# Patient Record
Sex: Female | Born: 1940 | Race: White | Hispanic: No | Smoking: Never smoker
Health system: Southern US, Community
[De-identification: ages and names within clinical notes are randomized; demographics above are authoritative.]

## PROBLEM LIST (undated history)

## (undated) DIAGNOSIS — I10 Essential (primary) hypertension: Secondary | ICD-10-CM

---

## 1997-06-03 ENCOUNTER — Other Ambulatory Visit: Admission: RE | Admit: 1997-06-03 | Discharge: 1997-06-03 | Payer: Self-pay | Admitting: Internal Medicine

## 2000-09-04 ENCOUNTER — Other Ambulatory Visit: Admission: RE | Admit: 2000-09-04 | Discharge: 2000-09-04 | Payer: Self-pay | Admitting: Gynecology

## 2002-03-15 ENCOUNTER — Encounter: Payer: Self-pay | Admitting: Emergency Medicine

## 2002-03-15 ENCOUNTER — Emergency Department (HOSPITAL_COMMUNITY): Admission: EM | Admit: 2002-03-15 | Discharge: 2002-03-15 | Payer: Self-pay | Admitting: Emergency Medicine

## 2009-06-22 DIAGNOSIS — J309 Allergic rhinitis, unspecified: Secondary | ICD-10-CM | POA: Insufficient documentation

## 2009-06-22 DIAGNOSIS — E559 Vitamin D deficiency, unspecified: Secondary | ICD-10-CM | POA: Insufficient documentation

## 2009-06-22 DIAGNOSIS — M810 Age-related osteoporosis without current pathological fracture: Secondary | ICD-10-CM | POA: Insufficient documentation

## 2010-02-06 DIAGNOSIS — R030 Elevated blood-pressure reading, without diagnosis of hypertension: Secondary | ICD-10-CM | POA: Insufficient documentation

## 2011-04-09 DIAGNOSIS — M549 Dorsalgia, unspecified: Secondary | ICD-10-CM | POA: Insufficient documentation

## 2012-06-02 DIAGNOSIS — H9190 Unspecified hearing loss, unspecified ear: Secondary | ICD-10-CM | POA: Insufficient documentation

## 2012-06-02 DIAGNOSIS — R946 Abnormal results of thyroid function studies: Secondary | ICD-10-CM | POA: Insufficient documentation

## 2013-07-16 DIAGNOSIS — F419 Anxiety disorder, unspecified: Secondary | ICD-10-CM | POA: Insufficient documentation

## 2013-08-26 DIAGNOSIS — G479 Sleep disorder, unspecified: Secondary | ICD-10-CM | POA: Insufficient documentation

## 2013-08-26 DIAGNOSIS — R232 Flushing: Secondary | ICD-10-CM | POA: Insufficient documentation

## 2013-08-26 DIAGNOSIS — R5381 Other malaise: Secondary | ICD-10-CM | POA: Insufficient documentation

## 2014-05-25 DIAGNOSIS — R600 Localized edema: Secondary | ICD-10-CM | POA: Insufficient documentation

## 2017-06-17 DIAGNOSIS — D72829 Elevated white blood cell count, unspecified: Secondary | ICD-10-CM | POA: Insufficient documentation

## 2017-06-24 DIAGNOSIS — J3489 Other specified disorders of nose and nasal sinuses: Secondary | ICD-10-CM | POA: Insufficient documentation

## 2017-06-24 DIAGNOSIS — R634 Abnormal weight loss: Secondary | ICD-10-CM | POA: Insufficient documentation

## 2017-06-28 ENCOUNTER — Other Ambulatory Visit: Payer: Self-pay | Admitting: Internal Medicine

## 2017-07-04 ENCOUNTER — Other Ambulatory Visit: Payer: Self-pay | Admitting: Internal Medicine

## 2017-07-04 DIAGNOSIS — R634 Abnormal weight loss: Secondary | ICD-10-CM

## 2017-07-07 ENCOUNTER — Ambulatory Visit
Admission: RE | Admit: 2017-07-07 | Discharge: 2017-07-07 | Disposition: A | Discharge status: Home / Self Care | Payer: Medicare Other | Source: Ambulatory Visit | Attending: Internal Medicine | Admitting: Internal Medicine

## 2017-07-07 DIAGNOSIS — R634 Abnormal weight loss: Secondary | ICD-10-CM

## 2017-07-07 MED ORDER — IOPAMIDOL (ISOVUE-300) INJECTION 61%
75.0000 mL | Freq: Once | INTRAVENOUS | Status: AC | PRN
  Administered 2017-07-07: 75 mL via INTRAVENOUS

## 2017-07-10 DIAGNOSIS — J479 Bronchiectasis, uncomplicated: Secondary | ICD-10-CM | POA: Insufficient documentation

## 2017-07-16 DIAGNOSIS — R350 Frequency of micturition: Secondary | ICD-10-CM | POA: Insufficient documentation

## 2017-08-06 ENCOUNTER — Encounter: Payer: Self-pay | Admitting: Pulmonary Disease

## 2017-08-06 ENCOUNTER — Ambulatory Visit (INDEPENDENT_AMBULATORY_CARE_PROVIDER_SITE_OTHER): Payer: Medicare Other | Admitting: Pulmonary Disease

## 2017-08-06 DIAGNOSIS — J479 Bronchiectasis, uncomplicated: Secondary | ICD-10-CM | POA: Diagnosis not present

## 2017-08-06 DIAGNOSIS — J471 Bronchiectasis with (acute) exacerbation: Secondary | ICD-10-CM

## 2017-08-06 NOTE — Patient Instructions (Signed)
We discussed that you have mild bronchiectasis which may be a result of full damage to the lungs or related to mycobacterial infection  Schedule PFTs. Schedule high-resolution CT chest without contrast in 6 months.  Meantime if you develop a bad chest cold with sputum production, call us so that we can obtain culture

## 2017-08-06 NOTE — Progress Notes (Signed)
   Subjective:    Patient ID: Maria BignessHelen M Juliana, female    DOB: Jun 02, 1940, 77 y.o.   MRN: [id number]  HPI  Chief Complaint  Patient presents with  . pulmonary consult    referred by Dr. Timothy Lassousso for bronchitis and pneumonia   77 year old never smoker presents for evaluation of abnormal CT scan. She had an episode of bronchitis in 12/2016 that was treated with cefdinir.  She had a.  Of usual state of health and then got sick again in 05/2017, this time she was initially given antibiotics over the phone and then came in for an office visit with chest x-ray suggesting pneumonia so she was treated with Levaquin for 10 days. CT chest was obtained 07/07/2017 which I personally reviewed, this showed tree-in-bud opacities and mild bronchiectasis in the right middle lobe, left upper lobe and lingula.  Possibility of atypical Mycobacterium infection or  aspiration was raised.   she feels recovered and is back to her baseline.  She reports temperature to 99 range and wonders if that is a fever.  She has lost about 10 pounds in the past year but attributes this to living by herself She denies persistent cough or night sweats.  Social She is a retired Government social research officeroffice assistant from [employer], she is widowed for 9 years  Past medical history of hypertension Past surgical history none  Family history of heart disease in her father    No past medical history on file.   Review of Systems  Constitutional: Positive for fatigue and fever.  HENT: Positive for postnasal drip, rhinorrhea, sinus pressure, sinus pain and sneezing.   Respiratory: Positive for cough.   Neurological: Positive for headaches.   Cardiovascular: negative for chest pain, dyspnea, lower extremity edema, orthopnea, palpitations and syncope  Gastrointestinal: negative for abdominal pain, constipation, diarrhea, melena, nausea and vomiting  Genitourinary:negative for dysuria, frequency and hematuria  Hematologic/lymphatic: negative for bleeding, easy  bruising and lymphadenopathy  Musculoskeletal:negative for arthralgias, muscle weakness and stiff joints   Endocrine: negative for diabetic symptoms including polydipsia, polyuria and weight loss     Objective:   Physical Exam   Gen. Pleasant, thin,, in no distress, normal affect ENT - no lesions, no post nasal drip Neck: No JVD, no thyromegaly, no carotid bruits Lungs: no use of accessory muscles, no dullness to percussion, clear without rales or rhonchi  Cardiovascular: Rhythm regular, heart sounds  normal, no murmurs or gallops, no peripheral edema Abdomen: soft and non-tender, no hepatosplenomegaly, BS normal. Musculoskeletal: No deformities, no cyanosis or clubbing Neuro:  alert, non focal        Assessment & Plan:

## 2017-08-06 NOTE — Assessment & Plan Note (Addendum)
We discussed that you have mild bronchiectasis which may be a result of old damage to the lungs or related to mycobacterial infection Aspiration seems unlikely since no history of dysphagia  Schedule PFTs. Schedule high-resolution CT chest without contrast in 6 months.  Meantime if you develop a bad chest cold with sputum production, call us so that we can obtain culture

## 2017-08-20 ENCOUNTER — Ambulatory Visit (INDEPENDENT_AMBULATORY_CARE_PROVIDER_SITE_OTHER): Payer: Medicare Other | Admitting: Pulmonary Disease

## 2017-08-20 DIAGNOSIS — J479 Bronchiectasis, uncomplicated: Secondary | ICD-10-CM

## 2017-08-20 LAB — PULMONARY FUNCTION TEST
DL/VA % PRED: 94 %
DL/VA: 4.57 ml/min/mmHg/L
DLCO unc % pred: 70 %
DLCO unc: 17.61 ml/min/mmHg
FEF 25-75 POST: 1.22 L/s
FEF 25-75 Pre: 1.23 L/sec
FEF2575-%Change-Post: 0 %
FEF2575-%PRED-PRE: 76 %
FEF2575-%Pred-Post: 76 %
FEV1-%Change-Post: 2 %
FEV1-%Pred-Post: 92 %
FEV1-%Pred-Pre: 90 %
FEV1-PRE: 1.89 L
FEV1-Post: 1.94 L
FEV1FVC-%Change-Post: 0 %
FEV1FVC-%PRED-PRE: 94 %
FEV6-%Change-Post: 3 %
FEV6-%Pred-Post: 101 %
FEV6-%Pred-Pre: 98 %
FEV6-POST: 2.72 L
FEV6-Pre: 2.62 L
FEV6FVC-%Change-Post: -1 %
FEV6FVC-%PRED-POST: 103 %
FEV6FVC-%Pred-Pre: 104 %
FVC-%Change-Post: 3 %
FVC-%PRED-PRE: 95 %
FVC-%Pred-Post: 98 %
FVC-POST: 2.76 L
FVC-PRE: 2.68 L
POST FEV1/FVC RATIO: 70 %
PRE FEV1/FVC RATIO: 71 %
Post FEV6/FVC ratio: 98 %
Pre FEV6/FVC Ratio: 100 %
RV % pred: 86 %
RV: 2.04 L
TLC % PRED: 89 %
TLC: 4.61 L

## 2017-08-20 NOTE — Progress Notes (Signed)
PFT done today. 

## 2017-10-27 DIAGNOSIS — Z1389 Encounter for screening for other disorder: Secondary | ICD-10-CM | POA: Insufficient documentation

## 2018-02-06 ENCOUNTER — Other Ambulatory Visit: Payer: Medicare Other

## 2018-02-09 ENCOUNTER — Ambulatory Visit
Admission: RE | Admit: 2018-02-09 | Discharge: 2018-02-09 | Disposition: A | Discharge status: Home / Self Care | Payer: Medicare Other | Source: Ambulatory Visit | Attending: Pulmonary Disease | Admitting: Pulmonary Disease

## 2018-02-09 DIAGNOSIS — J471 Bronchiectasis with (acute) exacerbation: Secondary | ICD-10-CM

## 2018-02-11 ENCOUNTER — Telehealth: Payer: Self-pay | Admitting: Pulmonary Disease

## 2018-02-11 NOTE — Telephone Encounter (Signed)
Advised pt of results. Pt understood and nothing further is needed.   Pt made an appt to see Dr. Vassie Loll 02/12/2018 at 1045.   Notes recorded by Oretha Milch, MD on 02/10/2018 at 8:55 AM EST Needs FU OV with me

## 2018-02-12 ENCOUNTER — Encounter: Payer: Self-pay | Admitting: Pulmonary Disease

## 2018-02-12 ENCOUNTER — Ambulatory Visit: Payer: Medicare Other | Admitting: Pulmonary Disease

## 2018-02-12 DIAGNOSIS — J479 Bronchiectasis, uncomplicated: Secondary | ICD-10-CM

## 2018-02-12 DIAGNOSIS — J471 Bronchiectasis with (acute) exacerbation: Secondary | ICD-10-CM | POA: Diagnosis not present

## 2018-02-12 NOTE — Addendum Note (Signed)
Addended by: Maurene Capes on: 02/12/2018 03:58 PM   Modules accepted: Orders

## 2018-02-12 NOTE — Progress Notes (Signed)
   Subjective:    Patient ID: Maria Gonzales, female    DOB: [birth year], 78 y.o.   MRN: [id number]  HPI  78 yo never smoker for follow-up of bronchiectasis  Chief Complaint  Patient presents with  . Follow-up    F/U to discuss CT results. States her breathing has been ok since last visit.      She denies cough or wheezing or shortness of breath. We discussed CT results and PFTs today. High-resolution CT chest 01/2018 shows diffuse bronchiectasis with mild mucoid impaction, appears progressive from 06/2017 but not clear that is related to high-resolution images.  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  Possibility of atypical Mycobacterium infection or  aspiration was raised.  PFTs 07/2017 no evidence of airway obstruction, ratio 71, FEV1 90%, FVC 95%, lung volumes preserved, DLCO 70%  Review of Systems Patient denies significant dyspnea,cough, hemoptysis,  chest pain, palpitations, pedal edema, orthopnea, paroxysmal nocturnal dyspnea, lightheadedness, nausea, vomiting, abdominal or  leg pains      Objective:   Physical Exam  Gen. Pleasant, thin, frail, in no distress ENT - no thrush, no pallor/icterus,no post nasal drip Neck: No JVD, no thyromegaly, no carotid bruits Lungs: no use of accessory muscles, no dullness to percussion, clear without rales or rhonchi  Cardiovascular: Rhythm regular, heart sounds  normal, no murmurs or gallops, no peripheral edema Musculoskeletal: No deformities, no cyanosis or clubbing        Assessment & Plan:

## 2018-02-12 NOTE — Patient Instructions (Addendum)
We discussed findings of bronchiectasis in your lungs and possibility of Mycobacterium infection.  Repeat high-resolution CT chest and PFTs in 1 year  Call us if you develop a cough that will not go away or repeated bronchitis

## 2018-02-12 NOTE — Assessment & Plan Note (Signed)
We discussed findings of bronchiectasis in your lungs and possibility of Mycobacterium infection. She is asymptomatic at present  Repeat high-resolution CT chest and PFTs in 1 year  Call us if you develop a cough that will not go away or repeated bronchitis If she has significant sputum production, would consider sputum culture for AFB

## 2018-11-02 DIAGNOSIS — H269 Unspecified cataract: Secondary | ICD-10-CM | POA: Insufficient documentation

## 2019-07-19 DIAGNOSIS — L299 Pruritus, unspecified: Secondary | ICD-10-CM | POA: Insufficient documentation

## 2019-07-19 DIAGNOSIS — L259 Unspecified contact dermatitis, unspecified cause: Secondary | ICD-10-CM | POA: Insufficient documentation

## 2019-12-23 LAB — IFOBT (OCCULT BLOOD): IFOBT: NEGATIVE

## 2020-02-08 IMAGING — CT CT CHEST HIGH RESOLUTION W/O CM
1 of 5 series · 15 of 32 positions shown, 19 images · non-contrast
Comparison: 07/07/2017.

CLINICAL DATA: Bronchiectasis with acute exacerbation.

EXAM:
CT CHEST WITHOUT CONTRAST
TECHNIQUE: Multidetector CT imaging of the chest was performed following the
standard protocol without intravenous contrast. High resolution
imaging of the lungs, as well as inspiratory and expiratory imaging,
was performed.

[Series 2: chest · axial · 0.66mm/px · z∈[-303,-11]mm · 15 of 164 slices shown, 19 images]
[im 9/164  mediastinal]
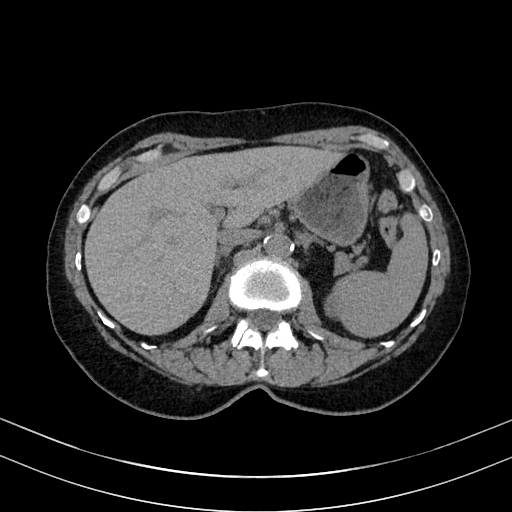
[im 9/164  lung]
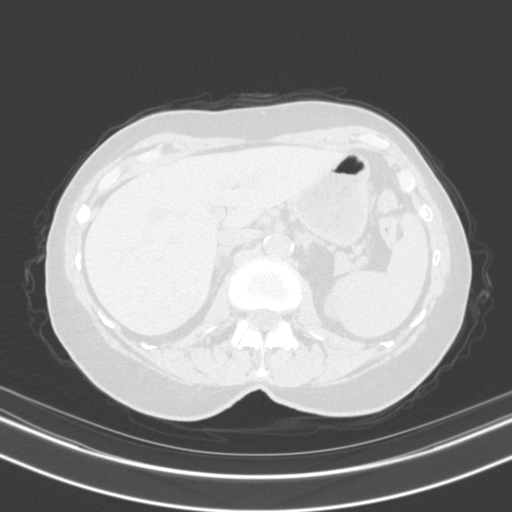
[im 25/164  lung]
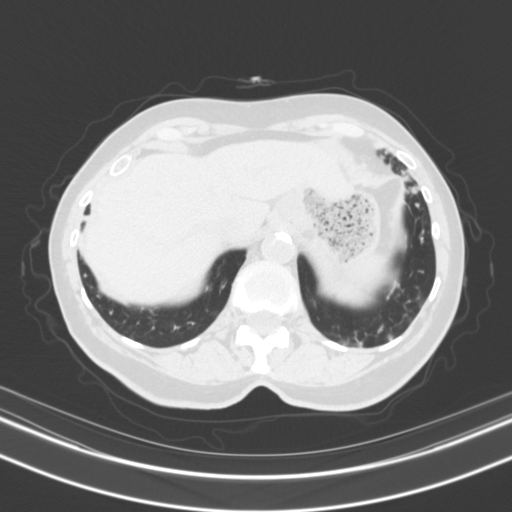
[im 33/164  lung]
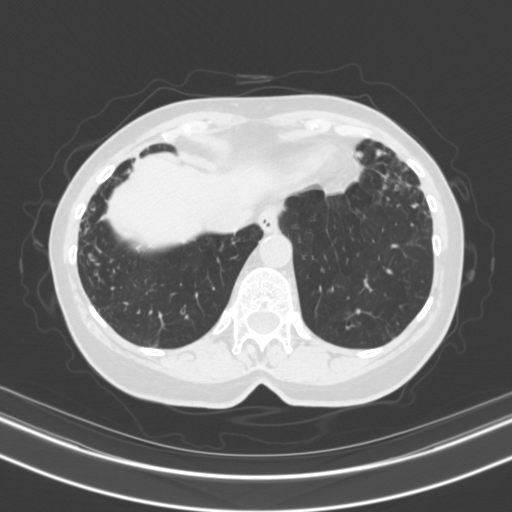
[im 41/164  lung]
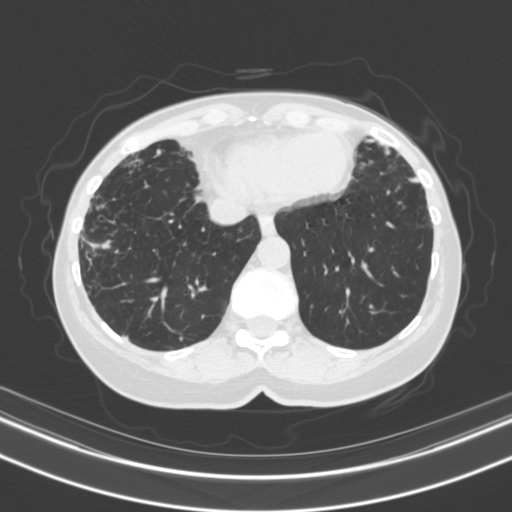
[im 58/164  mediastinal]
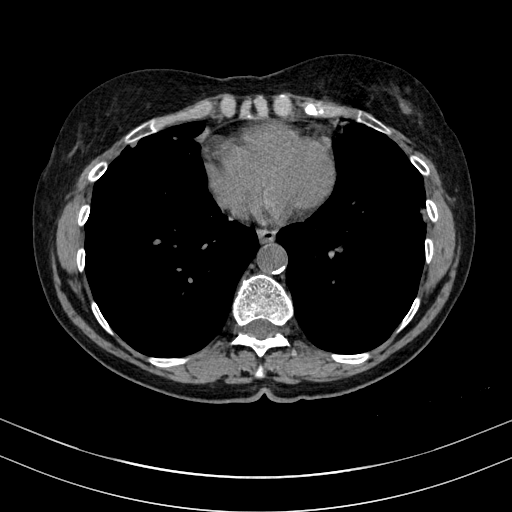
[im 58/164  lung]
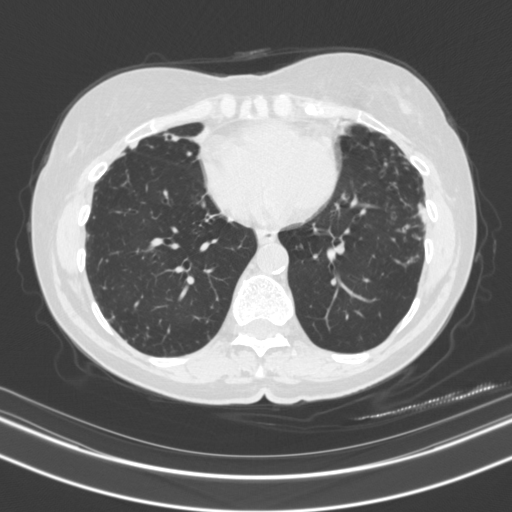
[im 66/164  lung]
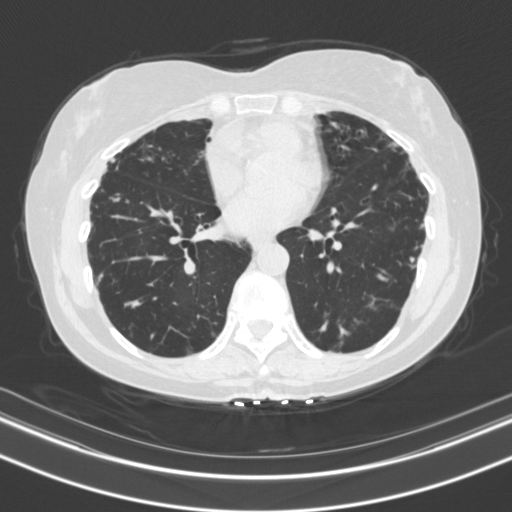
[im 74/164  lung]
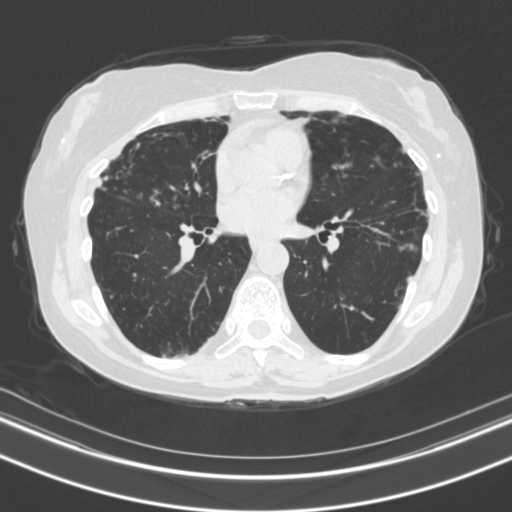
[im 82/164  lung]
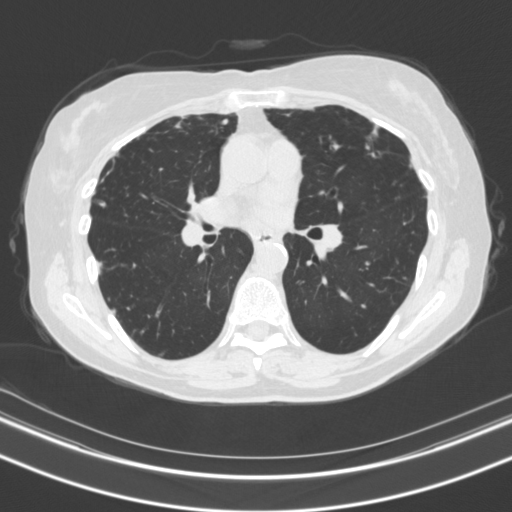
[im 90/164  mediastinal]
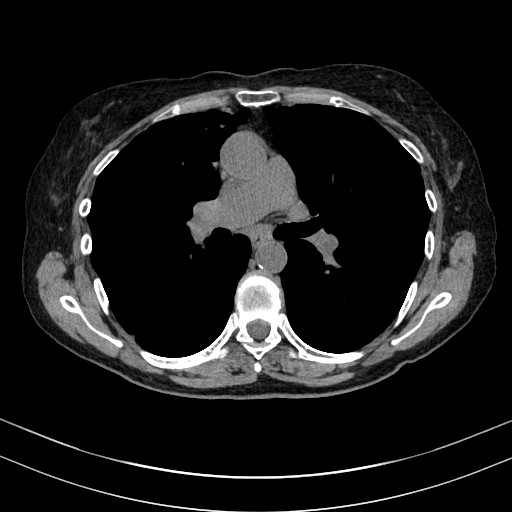
[im 90/164  lung]
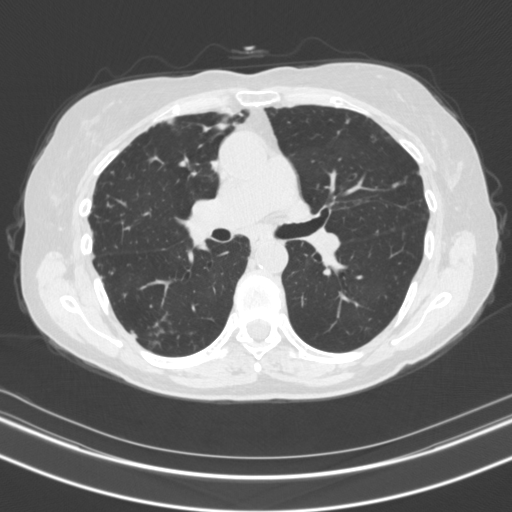
[im 98/164  lung]
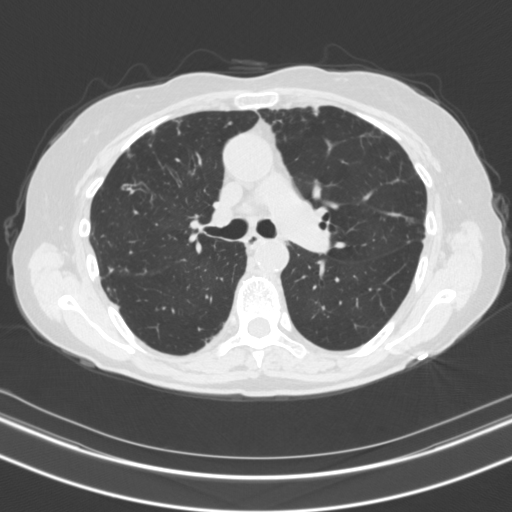
[im 115/164  lung]
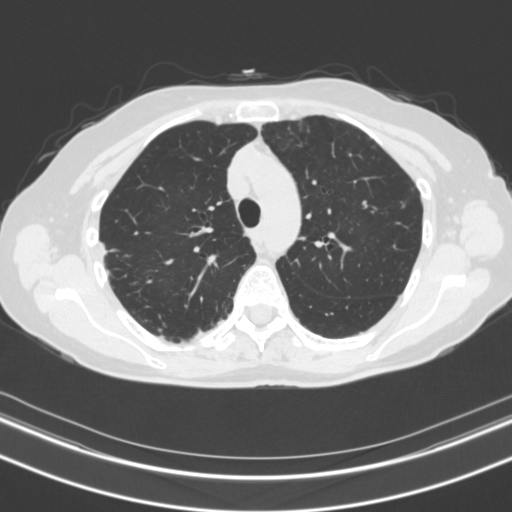
[im 123/164  lung]
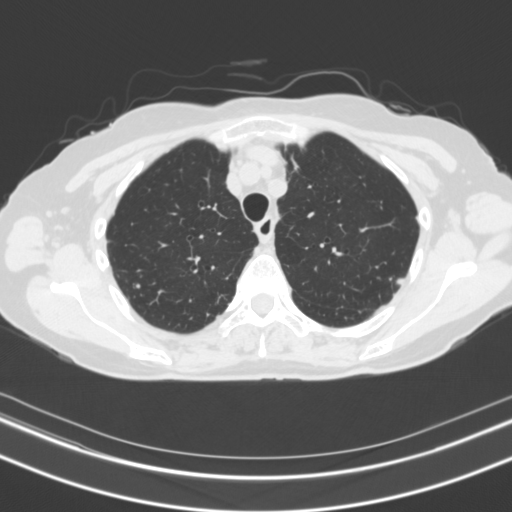
[im 131/164  mediastinal]
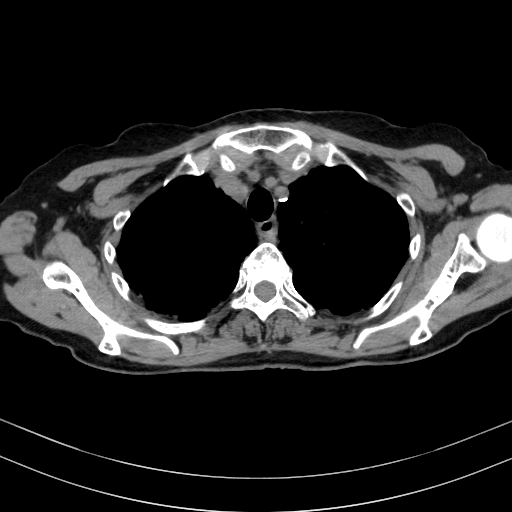
[im 131/164  lung]
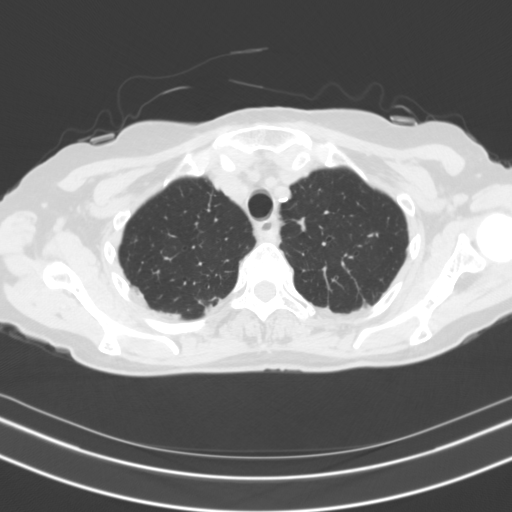
[im 147/164  lung]
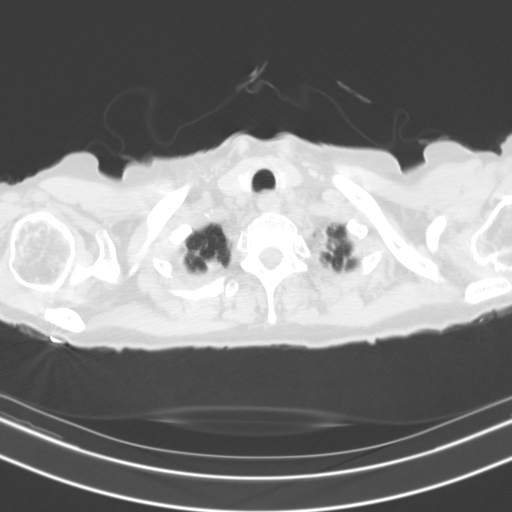
[im 155/164  lung]
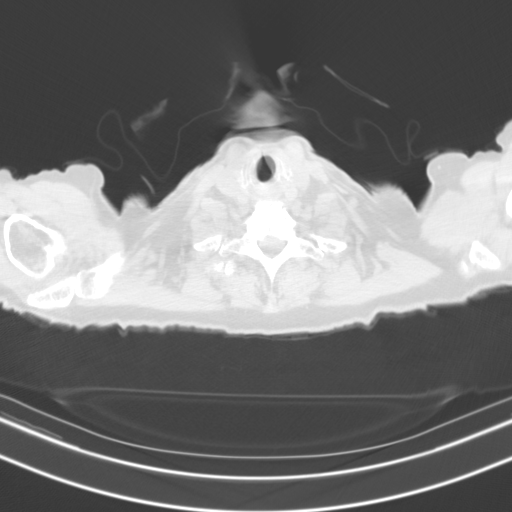

[15 of 32 positions shown; findings below may reference images not displayed]

FINDINGS: Cardiovascular: Atherosclerotic calcification of the aorta, aortic
valve and coronary arteries. Heart size normal. No pericardial
effusion.

Mediastinum/Nodes: Mediastinal lymph nodes measure up to 11 mm in
the precarinal station. The largest lymph node has a fatty hilum.
Hilar regions are difficult to evaluate without IV contrast. No
axillary adenopathy. Esophagus is grossly unremarkable.

Lungs/Pleura: Biapical pleuroparenchymal scarring. Fairly diffuse
bronchiectasis with peribronchovascular nodularity, mucoid impaction
and areas of subpleural consolidation. Overall, appearance appears
progressive from 07/07/2017. No pleural fluid. Airway is
unremarkable.

Upper Abdomen: Subcentimeter low-attenuation lesion in the right
hepatic lobe is too small to characterize. Visualized portions of
the liver, adrenal glands, left kidney, spleen, pancreas, stomach
and bowel are grossly unremarkable. No upper abdominal adenopathy.

Musculoskeletal: Degenerative changes in the spine. No worrisome
lytic or sclerotic lesions.
IMPRESSION: 1. Diffuse bronchiectasis with areas of peribronchovascular
nodularity, mucoid impaction and subpleural consolidation,
progressive from 07/07/2017. Findings are most indicative of
mycobacterium avium complex.
2. Aortic atherosclerosis (P1INR-170.0). Coronary artery
calcification.

## 2020-10-24 ENCOUNTER — Other Ambulatory Visit: Payer: Self-pay | Admitting: Internal Medicine

## 2020-10-24 DIAGNOSIS — J479 Bronchiectasis, uncomplicated: Secondary | ICD-10-CM

## 2021-03-08 ENCOUNTER — Emergency Department (HOSPITAL_COMMUNITY)
Admission: EM | Admit: 2021-03-08 | Discharge: 2021-03-08 | Disposition: A | Discharge status: Home / Self Care | Payer: Medicare Other | Attending: Emergency Medicine | Admitting: Emergency Medicine

## 2021-03-08 ENCOUNTER — Encounter (HOSPITAL_COMMUNITY): Payer: Self-pay

## 2021-03-08 ENCOUNTER — Emergency Department (HOSPITAL_COMMUNITY): Payer: Medicare Other

## 2021-03-08 ENCOUNTER — Other Ambulatory Visit: Payer: Self-pay

## 2021-03-08 DIAGNOSIS — M542 Cervicalgia: Secondary | ICD-10-CM | POA: Insufficient documentation

## 2021-03-08 DIAGNOSIS — I10 Essential (primary) hypertension: Secondary | ICD-10-CM | POA: Insufficient documentation

## 2021-03-08 DIAGNOSIS — Z79899 Other long term (current) drug therapy: Secondary | ICD-10-CM | POA: Diagnosis not present

## 2021-03-08 DIAGNOSIS — R079 Chest pain, unspecified: Secondary | ICD-10-CM | POA: Insufficient documentation

## 2021-03-08 DIAGNOSIS — J449 Chronic obstructive pulmonary disease, unspecified: Secondary | ICD-10-CM | POA: Diagnosis not present

## 2021-03-08 HISTORY — DX: Essential (primary) hypertension: I10

## 2021-03-08 LAB — BASIC METABOLIC PANEL
Anion gap: 12 (ref 5–15)
BUN: 16 mg/dL (ref 8–23)
CO2: 25 mmol/L (ref 22–32)
Calcium: 9.7 mg/dL (ref 8.9–10.3)
Chloride: 100 mmol/L (ref 98–111)
Creatinine, Ser: 1.1 mg/dL — ABNORMAL HIGH (ref 0.44–1.00)
GFR, Estimated: 51 mL/min — ABNORMAL LOW (ref >60)
Glucose, Bld: 103 mg/dL — ABNORMAL HIGH (ref 70–99)
Potassium: 3.7 mmol/L (ref 3.5–5.1)
Sodium: 137 mmol/L (ref 135–145)

## 2021-03-08 LAB — CBC
HCT: 40 % (ref 36.0–46.0)
Hemoglobin: 12.6 g/dL (ref 12.0–15.0)
MCH: 30.2 pg (ref 26.0–34.0)
MCHC: 31.5 g/dL (ref 30.0–36.0)
MCV: 95.9 fL (ref 80.0–100.0)
Platelets: 327 10*3/uL (ref 150–400)
RBC: 4.17 MIL/uL (ref 3.87–5.11)
RDW: 12.3 % (ref 11.5–15.5)
WBC: 13.2 10*3/uL — ABNORMAL HIGH (ref 4.0–10.5)
nRBC: 0 % (ref 0.0–0.2)

## 2021-03-08 LAB — D-DIMER, QUANTITATIVE: D-Dimer, Quant: 0.69 ug/mL-FEU — ABNORMAL HIGH (ref 0.00–0.50)

## 2021-03-08 LAB — TROPONIN I (HIGH SENSITIVITY)
Troponin I (High Sensitivity): 6 ng/L (ref <18)
Troponin I (High Sensitivity): 5 ng/L (ref <18)

## 2021-03-08 MED ORDER — SODIUM CHLORIDE 0.9 % IV BOLUS: 1000.0000 mL | Freq: Once | INTRAVENOUS | Status: DC

## 2021-03-08 NOTE — ED Triage Notes (Signed)
Pt arrived via [facility] from home for intermittent 5/10 right sided chest pain that radiates to upper back and radiates down left arm started today. Pt states she gets the right sided chest tightness with inspiration. Per EMS, pt was sinus arrhythmia on monitor. EMS gave ASA 324mg . Pt is A&Ox4. VSS. NSR on monitor

## 2021-03-08 NOTE — Discharge Instructions (Addendum)
You do not have any signs of blood clot in your lungs and have not had a heart attack today.  Please follow-up with your primary care provider within the week.  Also, I have attached a cardiology referral for you to use to discuss your occasional abnormal heart rhythm and chest pain if it continues.

## 2021-03-08 NOTE — ED Provider Notes (Signed)
[facility] EMERGENCY DEPARTMENT Provider Note   CSN: [id number] Arrival date & time: 03/08/21  1144     History  Chief Complaint  Patient presents with   Chest Pain    Maria Gonzales is a 81 y.o. female with a past medical history of hypertension on HCTZ and losartan presenting today due to chest pain.  She reports that earlier this morning she had chest pain that started in the right side of her chest.  This continued for 3 hours and naturally subsided.  Also endorsing increased discomfort in her neck that goes down her left arm but reports that this has been ongoing for multiple months.  No history of ACS.  Denies current pain, shortness of breath, dizziness or back pain.  Has never seen a cardiologist.  Follows closely with primary care provider at [facility].  Denies any recent illness, cough, fever, chills.  Has never had a blood clot.  No recent procedures or travel.  Home Medications Prior to Admission medications   Medication Sig Start Date End Date Taking? Authorizing Provider  hydrochlorothiazide (HYDRODIURIL) 12.5 MG tablet Take 12.5 mg by mouth daily.    [provider]  losartan (COZAAR) 25 MG tablet Take 25 mg by mouth daily.    [provider]      Allergies    Patient has no known allergies.    Review of Systems   Review of Systems  Constitutional:  Negative for chills and fever.  Respiratory:  Negative for cough, chest tightness and shortness of breath.   Cardiovascular:  Positive for chest pain. Negative for palpitations.  See HPI  Physical Exam Updated Vital Signs BP 135/61    Pulse 78    Temp 98.3 F (36.8 C) (Oral)    Resp 16    Ht 5\' 5"  (1.651 m)    Wt 51.7 kg    SpO2 96%    BMI 18.97 kg/m  Physical Exam Vitals and nursing note reviewed.  Constitutional:      General: She is not in acute distress.    Appearance: Normal appearance. She is not ill-appearing.  HENT:     Head: Normocephalic and atraumatic.   Eyes:     General: No scleral icterus.    Conjunctiva/sclera: Conjunctivae normal.  Cardiovascular:     Rate and Rhythm: Normal rate and regular rhythm.  Pulmonary:     Effort: Pulmonary effort is normal. No respiratory distress.     Breath sounds: Normal breath sounds.  Chest:     Chest wall: No mass or tenderness.  Skin:    Findings: No rash.  Neurological:     Mental Status: She is alert.  Psychiatric:        Mood and Affect: Mood normal.    ED Results / Procedures / Treatments   Labs (all labs ordered are listed, but only abnormal results are displayed) Labs Reviewed  BASIC METABOLIC PANEL - Abnormal; Notable for the following components:      Result Value   Glucose, Bld 103 (*)    Creatinine, Ser 1.10 (*)    GFR, Estimated 51 (*)    All other components within normal limits  CBC - Abnormal; Notable for the following components:   WBC 13.2 (*)    All other components within normal limits  D-DIMER, QUANTITATIVE - Abnormal; Notable for the following components:   D-Dimer, Quant 0.69 (*)    All other components within normal limits  TROPONIN I (HIGH SENSITIVITY)  TROPONIN I (HIGH SENSITIVITY)    EKG EKG Interpretation  Date/Time:  Thursday March 08 2021 11:49:37 EST Ventricular Rate:  82 PR Interval:  157 QRS Duration: 103 QT Interval:  370 QTC Calculation: 433 R Axis:   47 Text Interpretation: Sinus rhythm Atrial premature complex Left atrial enlargement Low voltage, precordial leads Consider anterior infarct No significant change since last tracing Confirmed by Blanchie Dessert [phone]) on 03/08/2021 12:08:11 PM  Radiology No results found.  Procedures Procedures  Patient is in normal sinus rhythm with a normal rate, occasional changes to atrial fibrillation but quickly back to normal sinus rhythm.  Medications Ordered in ED Medications - No data to display  ED Course/ Medical Decision Making/ A&P                           Medical Decision  Making Amount and/or Complexity of Data Reviewed Labs: ordered. Radiology: ordered.  81 year old female with a heart score of 3 presenting to the ED with a complaint of chest pain. The emergent differential diagnosis of chest pain includes: Acute coronary syndrome, pericarditis, aortic dissection, pulmonary embolism, tension pneumothorax, and esophageal rupture.  All of these were considered throughout the evaluation the patient.  Co morbidities that complicate the patient evaluation include: Age, hypertension  Additional history obtained from internal and external records: I was able to review patient's [facility] medical records in which patient's provider seems to be focusing on controlling her hypertension.  No cardiac history recorded.   Workup:  Lab Tests:  I Ordered, and personally interpreted labs.   The pertinent results include:  Negative troponin x2  2. Imaging Studies ordered:  I ordered a chest x-ray.  Individually interpreted this and I agree with the radiologist that there are increased interstitial markings.  I believe this favors pulmonary scarring over a pneumonia given clinical picture.  3. Cardiac Monitoring:  The patient was maintained on a cardiac monitor.  I personally viewed and interpreted the cardiac monitored which showed normal sinus rhythm with a normal rate.  Occasionally, patient with go into sinus arrhythmia however rapidly returned to normal sinus rhythm.  It is possible that this is what happened in the past when she was told that she had an abnormal heart rhythm.  4. Test Considered: CTPA however patient's age-adjusted dimer is negative   5. Consultations Obtained:  I spoke with my attending physician about this patient who also evaluated her.  She agrees that patient is likely stable for discharge with close follow-up with her primary care provider.   Treatment:  Medications:  No medications were given due to patient's asymptomatic nature at  this time.   Dispo:  Problem List / ED Course:  COPD and nonspecific chest pain social determinants of health include: Patient's spouse is deceased and she now is living alone.  Reports that she has a caretaker who helps make her decisions.   Dispostion:  After the interventions noted above, I reevaluated the patient and found that the she continues to deny active chest pain.  No shortness of breath either.  At this time, I believe patient is stable for discharge home with return precautions.  I will also give her a referral to cardiology that she may use if needed after she follows up with her primary care provider within the next week.  She voices understanding that following up with a cardiologist might be a good idea due to this nonspecific chest pain and occasional bouts of  arrhythmia.   Discharged in stable condition.  Final Clinical Impression(s) / ED Diagnoses Final diagnoses:  Nonspecific chest pain    Rx / DC Orders Results and diagnoses were explained to the patient. Return precautions discussed in full. Patient had no additional questions and expressed complete understanding.   This chart was dictated using voice recognition software.  Despite best efforts to proofread,  errors can occur which can change the documentation meaning.    Darliss Ridgel 03/08/21 1513    Blanchie Dessert, MD 03/08/21 2134

## 2021-04-09 DIAGNOSIS — R072 Precordial pain: Secondary | ICD-10-CM | POA: Insufficient documentation

## 2021-04-09 NOTE — Progress Notes (Signed)
?  ?Cardiology Office Note ? ? ?Date:  04/10/2021  ? ?ID:  Maria Gonzales, DOB [birth year], MRN [id number] ? ?PCP:  Creola Corn, MD  ?Cardiologist:   Rollene Rotunda, MD ?Referring:  Creola Corn, MD ? ? ?Chief Complaint  ?Patient presents with  ? Chest Pain  ? ? ?  ?History of Present Illness: ?Maria Gonzales is a 81 y.o. female who presents for evaluation of chest pain. She was in the ED in Feb for this.  I reviewed these records for this visit.    There was no evidence of ischemia.  She has no prior cardiac history except for a stress test years ago.  I did see a 2020 CT that demonstrated aortic atherosclerosis and coronary calcification.  There was a question of changes consistent with an atypical Mycobacterium.  She is being considered for follow-up imaging. ? ?She says that the day she had this chest discomfort it was right-sided.  It was sudden and sharp.  It was moderate to severe.  It did ease off by itself and was almost gone by the time she got to the emergency room.  She has not had this discomfort before or since.  She does do her activities of daily living and does not bring this on.  She has not had any radiation to her neck or to her arms of this she has soreness across her shoulders.  She does not have any new shortness of breath, PND or orthopnea.  She does do some walking for exercise.  She has had no palpitations, presyncope or syncope. ? ? ?Past Medical History:  ?Diagnosis Date  ? Hypertension   ? ? ?PSH:  None ? ? ?Current Outpatient Medications  ?Medication Sig Dispense Refill  ? CALCIUM PO Take 1 tablet by mouth daily.    ? cholecalciferol (VITAMIN D3) 25 MCG (1000 UNIT) tablet Take 1,000 Units by mouth daily.    ? Cyanocobalamin (VITAMIN B-12 PO) Take 1 tablet by mouth daily.    ? hydrochlorothiazide (HYDRODIURIL) 12.5 MG tablet Take 12.5 mg by mouth daily.    ? valsartan (DIOVAN) 320 MG tablet Take 320 mg by mouth daily.    ? ?No current facility-administered medications for this visit.   ? ? ?Allergies:   Patient has no known allergies.  ? ? ?Social History:  The patient  reports that she has never smoked. She has never used smokeless tobacco. She reports that she does not drink alcohol and does not use drugs.  ? ?Family History:  The patient's father died suddenly at age 40 but there was no history of coronary disease.  Her mother lived to be 38.  She has a sister with out any heart disease. ? ? ?ROS:  Please see the history of present illness.   Otherwise, review of systems are positive for none.   All other systems are reviewed and negative.  ? ? ?PHYSICAL EXAM: ?VS:  BP (!) 148/72   Pulse 85   Ht 5\' 5"  (1.651 m)   Wt 120 lb 12.8 oz (54.8 kg)   SpO2 98%   BMI 20.10 kg/m?  , BMI Body mass index is 20.1 kg/m?. ?GENERAL:  Well appearing ?HEENT:  Pupils equal round and reactive, fundi not visualized, oral mucosa unremarkable ?NECK:  No jugular venous distention, waveform within normal limits, carotid upstroke brisk and symmetric, no bruits, no thyromegaly ?LYMPHATICS:  No cervical, inguinal adenopathy ?LUNGS:  Clear to auscultation bilaterally ?BACK:  No CVA tenderness ?CHEST:  Unremarkable ?HEART:  PMI not displaced or sustained,S1 and S2 within normal limits, no S3, no S4, no clicks, no rubs, no murmurs ?ABD:  Flat, positive bowel sounds normal in frequency in pitch, no bruits, no rebound, no guarding, no midline pulsatile mass, no hepatomegaly, no splenomegaly ?EXT:  2 plus pulses throughout, no edema, no cyanosis no clubbing ?SKIN:  No rashes no nodules ?NEURO:  Cranial nerves II through XII grossly intact, motor grossly intact throughout ?PSYCH:  Cognitively intact, oriented to person place and time ? ? ? ?EKG:  EKG is ordered today. ?The ekg ordered today demonstrates sinus rhythm, rate 85, axis within normal limits, intervals within normal limits, no acute ST-T wave changes. ? ? ?Recent Labs: ?03/08/2021: BUN 16; Creatinine, Ser 1.10; Hemoglobin 12.6; Platelets 327; Potassium 3.7; Sodium  137  ? ? ?Lipid Panel ?No results found for: CHOL, TRIG, HDL, CHOLHDL, VLDL, LDLCALC, LDLDIRECT ?  ? ?Wt Readings from Last 3 Encounters:  ?04/10/21 120 lb 12.8 oz (54.8 kg)  ?03/08/21 113 lb 15.7 oz (51.7 kg)  ?02/12/18 114 lb (51.7 kg)  ?  ? ? ?Other studies Reviewed: ?Additional studies/ records that were reviewed today include: Primary care office records, ED records, CT 2020. ?Review of the above records demonstrates:  Please see elsewhere in the note.   ? ? ?ASSESSMENT AND PLAN: ? ?CHEST PAIN: Her chest discomfort has characteristics more consistent with nonanginal than anginal etiology but she does have coronary calcium.  I will bring the patient back for a POET (Plain Old Exercise Test). This will allow me to screen for obstructive coronary disease, risk stratify and very importantly provide a prescription for exercise. ? ?DYSLIPIDEMIA: Her LDL is elevated 134.  I will defer to her primary provider but might suggest considering statin therapy given the coronary calcium and aortic atherosclerosis with a goal LDL less than 100. ? ?HTN: Her blood pressures at home are elevated.  She is going to keep a blood pressure diary and I might at least suggest increasing her chlorthalidone versus adding amlodipine. ? ?AORTIC ATHEROSCLEROSIS:   This can be managed with primary risk reduction. ? ?Current medicines are reviewed at length with the patient today.  The patient does not have concerns regarding medicines. ? ?The following changes have been made:  no change ? ?Labs/ tests ordered today include:  ? ?Orders Placed This Encounter  ?Procedures  ? Exercise Tolerance Test  ? EKG 12-Lead  ? ? ? ?Disposition:   FU with me based on the results of the above testing. ? ? ?Signed, ?Rollene Rotunda, MD  ?04/10/2021 6:04 PM    ?[facility] ? ? ? ?

## 2021-04-10 ENCOUNTER — Other Ambulatory Visit: Payer: Self-pay

## 2021-04-10 ENCOUNTER — Encounter: Payer: Self-pay | Admitting: Cardiology

## 2021-04-10 ENCOUNTER — Ambulatory Visit: Payer: Medicare Other | Admitting: Cardiology

## 2021-04-10 VITALS — BP 148/72 | HR 85 | Ht 65.0 in | Wt 120.8 lb

## 2021-04-10 DIAGNOSIS — I10 Essential (primary) hypertension: Secondary | ICD-10-CM

## 2021-04-10 DIAGNOSIS — R931 Abnormal findings on diagnostic imaging of heart and coronary circulation: Secondary | ICD-10-CM

## 2021-04-10 DIAGNOSIS — E785 Hyperlipidemia, unspecified: Secondary | ICD-10-CM

## 2021-04-10 DIAGNOSIS — I700 Atherosclerosis of aorta: Secondary | ICD-10-CM | POA: Diagnosis not present

## 2021-04-10 DIAGNOSIS — R072 Precordial pain: Secondary | ICD-10-CM

## 2021-04-10 NOTE — Patient Instructions (Signed)
Medication Instructions:  ?No changes ?*If you need a refill on your cardiac medications before your next appointment, please call your pharmacy* ? ? ?Lab Work: ?None ordered ?If you have labs (blood work) drawn today and your tests are completely normal, you will receive your results only by: ?MyChart Message (if you have MyChart) OR ?A paper copy in the mail ?If you have any lab test that is abnormal or we need to change your treatment, we will call you to review the results. ? ? ?Testing/Procedures: ?Your physician has requested that you have an exercise tolerance test in 10 days. For further information please visit [email or web address] Please also follow instruction sheet, as given. This will take place at [address] [facility], [address]. ?Do not drink or eat foods with caffeine for 24 hours before the test. (Chocolate, coffee, tea, or energy drinks) ?If you use an inhaler, bring it with you to the test. ?Do not smoke for 4 hours before the test. ?Wear comfortable shoes and clothing. ? ? ? ?Follow-Up: ?At [facility], you and your health needs are our priority.  As part of our continuing mission to provide you with exceptional heart care, we have created designated Provider Care Teams.  These Care Teams include your primary Cardiologist (physician) and Advanced Practice Providers (APPs -  Physician Assistants and Nurse Practitioners) who all work together to provide you with the care you need, when you need it. ? ?We recommend signing up for the patient portal called "MyChart".  Sign up information is provided on this After Visit Summary.  MyChart is used to connect with patients for Virtual Visits (Telemedicine).  Patients are able to view lab/test results, encounter notes, upcoming appointments, etc.  Non-urgent messages can be sent to your provider as well.   ?To learn more about what you can do with MyChart, go to [email or web address].   ? ?Your next appointment:   ?Follow up as needed with Dr.  Antoine Poche ? ?Other Instructions ?Dr. Antoine Poche would like you to check your blood pressure daily for the next  week.  Keep a journal of these daily blood pressure and heart rate readings and call our office or send a message through MyChart with the results. Thank you! ? ?It is best to check your BP 1-2 hours after taking your medications to see the medications effectiveness on your BP.  ?  ?Here are some tips that our clinical pharmacists share for home BP monitoring: ??         Rest 10 minutes before taking your blood pressure. ??         Don't smoke or drink caffeinated beverages for at least 30 minutes before. ??         Take your blood pressure before (not after) you eat. ??         Sit comfortably with your back supported and both feet on the floor (don't cross your legs). ??         Elevate your arm to heart level on a table or a desk. ??         Use the proper sized cuff. It should fit smoothly and snugly around your bare upper arm. There should be enough room to slip a fingertip under the cuff. The bottom edge of the cuff should be 1 inch above the crease of the elbow. ? ? ?

## 2021-04-26 ENCOUNTER — Telehealth (HOSPITAL_COMMUNITY): Payer: Self-pay | Admitting: *Deleted

## 2021-04-26 NOTE — Telephone Encounter (Signed)
Close encounter 

## 2021-04-27 ENCOUNTER — Ambulatory Visit (HOSPITAL_COMMUNITY)
Admission: RE | Admit: 2021-04-27 | Discharge: 2021-04-27 | Disposition: A | Discharge status: Home / Self Care | Payer: Medicare Other | Source: Ambulatory Visit | Attending: Cardiology | Admitting: Cardiology

## 2021-04-27 DIAGNOSIS — R072 Precordial pain: Secondary | ICD-10-CM | POA: Diagnosis present

## 2021-04-27 LAB — EXERCISE TOLERANCE TEST
Angina Index: 0
Duke Treadmill Score: 3
Estimated workload: 4.6
Exercise duration (min): 3 min
Exercise duration (sec): 0 s
MPHR: 140 {beats}/min
Peak HR: 150 {beats}/min
Percent HR: 107 %
Rest HR: 88 {beats}/min
ST Depression (mm): 0 mm

## 2021-05-28 ENCOUNTER — Encounter: Payer: Self-pay | Admitting: *Deleted

## 2023-01-23 ENCOUNTER — Other Ambulatory Visit: Payer: Self-pay | Admitting: Internal Medicine

## 2023-01-23 DIAGNOSIS — J479 Bronchiectasis, uncomplicated: Secondary | ICD-10-CM

## 2023-01-30 ENCOUNTER — Telehealth: Payer: Self-pay | Admitting: Dermatology

## 2023-02-17 ENCOUNTER — Ambulatory Visit
Admission: RE | Admit: 2023-02-17 | Discharge: 2023-02-17 | Disposition: A | Discharge status: Home / Self Care | Payer: Medicare Other | Source: Ambulatory Visit | Attending: Internal Medicine | Admitting: Internal Medicine

## 2023-02-17 DIAGNOSIS — J479 Bronchiectasis, uncomplicated: Secondary | ICD-10-CM

## 2023-02-24 ENCOUNTER — Ambulatory Visit: Payer: Medicare Other | Admitting: Dermatology

## 2023-06-01 NOTE — Progress Notes (Unsigned)
 Maria Gonzales, female    DOB: 04-Jul-1940   MRN: [id number]   Brief patient profile:  5 yowf never smoker grew up taking care of chickens  referred to pulmonary clinic 06/02/2023 by Dr Mamie Searles  for bronchiectasis last saw [facility] 2020   with   dx of bronchiectasis / nl pfts    Unusual cough in late summer of 2024 > turned purulent and rx abx and HRCT  02/17/23 worse bronchiectasis / mai     History of Present Illness  06/02/2023  Pulmonary/ 1st office eval/Maria Gonzales  Chief Complaint  Patient presents with   Consult    Bronchiectasis  Dyspnea:  yardwork [place]  thru woods to another cabin her husband build on their farm s limiting sob  Cough: quite sslt off color  Sleep: bed is flat with one pillow  SABA use: none  02 QMV:HQIO     No obvious day to day or daytime pattern/variability or assoc   mucus plugs or hemoptysis or cp or chest tightness, subjective wheeze or overt   hb symptoms.    Also denies any obvious fluctuation of symptoms with weather or environmental changes or other aggravating or alleviating factors except as outlined above   No unusual exposure hx or h/o childhood pna/ asthma or knowledge of premature birth.  Current Allergies, Complete Past Medical History, Past Surgical History, Family History, and Social History were reviewed in [employer] record.  ROS  The following are not active complaints unless bolded Hoarseness, sore throat, dysphagia, dental problems, itching, sneezing,  nasal congestion or discharge of excess mucus or purulent secretions, ear ache,   fever, chills, sweats, unintended wt loss or wt gain, classically pleuritic or exertional cp,  orthopnea pnd or arm/hand swelling  or leg swelling, presyncope, palpitations, abdominal pain, anorexia, nausea, vomiting, diarrhea  or change in bowel habits or change in bladder habits, change in stools or change in urine, dysuria, hematuria,  rash, arthralgias, visual complaints, headache,  numbness, weakness or ataxia or problems with walking or coordination,  change in mood or  memory.             Outpatient Medications Prior to Visit  Medication Sig Dispense Refill   CALCIUM PO Take 1 tablet by mouth daily.     cholecalciferol (VITAMIN D3) 25 MCG (1000 UNIT) tablet Take 1,000 Units by mouth daily.     Cyanocobalamin (VITAMIN B-12 PO) Take 1 tablet by mouth daily.     hydrochlorothiazide (HYDRODIURIL) 12.5 MG tablet Take 12.5 mg by mouth daily.     valsartan (DIOVAN) 320 MG tablet Take 320 mg by mouth daily.     No facility-administered medications prior to visit.    Past Medical History:  Diagnosis Date   Hypertension       Objective:     BP (!) 110/50 (BP Location: Left Arm, Patient Position: Sitting, Cuff Size: Normal)   Pulse 91   Temp 98.3 F (36.8 C) (Oral)   Ht 5' 3.5" (1.613 m)   Wt 119 lb 12.8 oz (54.3 kg)   SpO2 94%   BMI 20.89 kg/m   SpO2: 94 % RA   Pleasant amb wf nad    HEENT : Oropharynx  clear      Nasal turbinates nl    NECK :  without  apparent JVD/ palpable Nodes/TM    LUNGS: no acc muscle use,  Nl contour chest which is clear to A and P bilaterally without cough on insp or  exp maneuvers   CV:  RRR  no s3 or murmur or increase in P2, and no edema   ABD:  soft and nontender   MS:  Gait nl   ext warm without deformities Or obvious joint restrictions  calf tenderness, cyanosis or clubbing    SKIN: warm and dry without lesions    NEURO:  alert, approp, nl sensorium with  no motor or cerebellar deficits apparent.     CXR PA and Lateral:   06/02/2023 :    I personally reviewed images and impression is as follows:     C./w mild / mod bronchiectasis/ MAI s consolidation     Assessment   Bronchiectasis (HCC)   HRCT  02/17/23 worse bronchiectasis / MAI - 06/02/2023 rec mucinex max/ flutter valve and doxy x 10 d cycles prn nasy mucus   This is an extremely common benign condition in the elderly and does not warrant aggressive  eval/ rx at this point unless there is a clinical correlation suggesting unaddressed pulmonary infection (purulent sputum p course of doxy, night sweats, unintended wt loss, doe) or evolution of  obvious changes on plain cxr (as opposed to serial CT, which is way over sensitive to make clinical decisions re intervention and treatment in the elderly, who tend to tolerate both dx and treatment poorly) .  In fact, in study published July 2024 in AT[facility],  patients with bronchiectasis with vs without proven atypical TB had similar outcomes x 5 years of the study, in temis of admits/loss of lung function, exac and death.   So rec clinical f/u starting with 3 months on present rx with low threshold to do VEST next if above not effective   Discussed in detail all the  indications, usual  risks and alternatives  relative to the benefits with patient who agrees to proceed with Rx as outlined.             Each maintenance medication was reviewed in detail including emphasizing most importantly the difference between maintenance and prns and under what circumstances the prns are to be triggered using an action plan format where appropriate.  Total time for H and P, chart review, counseling, reviewing flutter  device(s) and generating customized AVS unique to this office visit / same day charting = 49 min with pt new to me.           Vernestine Gondola, MD 06/02/2023

## 2023-06-02 ENCOUNTER — Ambulatory Visit (INDEPENDENT_AMBULATORY_CARE_PROVIDER_SITE_OTHER)

## 2023-06-02 ENCOUNTER — Ambulatory Visit: Admitting: Internal Medicine

## 2023-06-02 ENCOUNTER — Encounter: Payer: Self-pay | Admitting: Internal Medicine

## 2023-06-02 VITALS — BP 110/50 | HR 91 | Temp 98.3°F | Ht 63.5 in | Wt 119.8 lb

## 2023-06-02 DIAGNOSIS — J479 Bronchiectasis, uncomplicated: Secondary | ICD-10-CM

## 2023-06-02 MED ORDER — DOXYCYCLINE HYCLATE 100 MG PO TABS: 100.0000 mg | ORAL_TABLET | Freq: Two times a day (BID) | ORAL | 11 refills | Status: AC

## 2023-06-02 NOTE — Patient Instructions (Addendum)
 Bronchiectasis =   you have scarring of your bronchial tubes which means that they don't function perfectly normally and mucus tends to pool in certain areas of your lung which can cause pneumonia and further scarring of your lung and bronchial tubes  For cough/ congestion > mucinex or mucinex dm  up to maximum of  1200 mg every 12 hours and use the flutter valve as much as you can    When mucus turns nasty > doxycyline 100 mg twice daily before meals with glass of water (refillable)   Please remember to go to the  x-ray department  for your tests - we will call you with the results when they are available   Please remember to go to the lab department   for your tests - we will call you with the results when they are available.      Please schedule a follow up visit in 3 months but call sooner if needed

## 2023-06-02 NOTE — Assessment & Plan Note (Signed)
 HRCT  02/17/23 worse bronchiectasis / MAI - 06/02/2023 rec mucinex max/ flutter valve and doxy x 10 d cycles prn nasy mucus   This is an extremely common benign condition in the elderly and does not warrant aggressive eval/ rx at this point unless there is a clinical correlation suggesting unaddressed pulmonary infection (purulent sputum p course of doxy, night sweats, unintended wt loss, doe) or evolution of  obvious changes on plain cxr (as opposed to serial CT, which is way over sensitive to make clinical decisions re intervention and treatment in the elderly, who tend to tolerate both dx and treatment poorly) .  In fact, in study published July 2024 in ATS,  patients with bronchiectasis with vs without proven atypical TB had similar outcomes x 5 years of the study, in temis of admits/loss of lung function, exac and death.   So rec clinical f/u starting with 3 months on present rx with low threshold to do VEST next if above not effective   Discussed in detail all the  indications, usual  risks and alternatives  relative to the benefits with patient who agrees to proceed with Rx as outlined.             Each maintenance medication was reviewed in detail including emphasizing most importantly the difference between maintenance and prns and under what circumstances the prns are to be triggered using an action plan format where appropriate.  Total time for H and P, chart review, counseling, reviewing flutter  device(s) and generating customized AVS unique to this office visit / same day charting = 49 min with pt new to me.

## 2023-06-04 ENCOUNTER — Ambulatory Visit: Payer: Self-pay

## 2023-06-04 ENCOUNTER — Telehealth: Payer: Self-pay

## 2023-06-04 LAB — IGE: IgE (Immunoglobulin E), Serum: 331 kU/L — ABNORMAL HIGH (ref <=114)

## 2023-06-04 LAB — QUANTIFERON-TB GOLD PLUS
Mitogen-NIL: 2 [IU]/mL
NIL: 0.01 [IU]/mL
QuantiFERON-TB Gold Plus: NEGATIVE
TB1-NIL: 0 [IU]/mL
TB2-NIL: 0 [IU]/mL

## 2023-06-04 LAB — IGG, IGA, IGM
IgG (Immunoglobin G), Serum: 1152 mg/dL (ref 600–1540)
IgM, Serum: 86 mg/dL (ref 50–300)
Immunoglobulin A: 270 mg/dL (ref 70–320)

## 2023-06-04 NOTE — Telephone Encounter (Signed)
 Copied from CRM [id number]. Topic: Clinical - Lab/Test Results >> Jun 04, 2023 11:39 AM Maria Gonzales wrote: Reason for CRM: Pt called back after missing a call from [facility] regarding her results. I attempted to let the patient know that Maria Gonzales was calling due to Dr. Waymond Gonzales stating on 06/02/2023 at 836am that the pt has no acute change so no change in recommendations made at her office visit. Pt took a moment to trust the information I was relaying and wanted to make sure this message reached Dr. Waymond Gonzales and his nursing staff.   Pt was told the information regarding the missed call. NFN

## 2023-06-06 LAB — ALPHA-1-ANTITRYPSIN PHENOTYP: A-1 Antitrypsin: 177 mg/dL (ref 101–187)

## 2023-06-10 ENCOUNTER — Telehealth: Payer: Self-pay | Admitting: Internal Medicine

## 2023-06-10 NOTE — Telephone Encounter (Signed)
 I believe she is referring to a flutter valve so try to help over the phone but be sure brings with her to next ov

## 2023-06-10 NOTE — Telephone Encounter (Signed)
 I spoke with the patient regarding the use of the flutter valve. She seemed to understand the instructions. She had questions about the doxycycline  Dr. Waymond Hailey prescribed, and I clarified everything for her. She confirmed her understanding.

## 2023-06-10 NOTE — Telephone Encounter (Signed)
 Per Devra Fontana at [employer] the Below:  "can you have the nurse or someone call the pt with instructions on how to use. I did tell her she needed someone clinical to give her those instructions and she stated she can never get through."  Thanks!

## 2023-06-23 NOTE — Telephone Encounter (Signed)
 Copied from CRM [id number]. Topic: Clinical - Medical Advice >> Jun 19, 2023  1:31 PM Corean Deutscher wrote: Reason for CRM: Patient called and she has a question in regards to her flutter valve she received, patient stated she has questions in regards to how to use it and she would like Samantha to follow up with her as she has the information regarding the flutter valve.  Routing to [place], [place] per patient request. Thanks

## 2023-06-23 NOTE — Telephone Encounter (Signed)
 Spoke with patient regarding her flutter valve and answered her questions on how to use it, she stated her understanding. Reminded her to bring it to her next OV.

## 2023-07-03 ENCOUNTER — Ambulatory Visit: Admitting: Internal Medicine

## 2023-07-03 ENCOUNTER — Ambulatory Visit: Payer: Medicare Other | Admitting: Dermatology

## 2023-07-03 ENCOUNTER — Encounter: Payer: Self-pay | Admitting: Dermatology

## 2023-07-03 VITALS — BP 120/77 | HR 100

## 2023-07-03 DIAGNOSIS — M542 Cervicalgia: Secondary | ICD-10-CM | POA: Insufficient documentation

## 2023-07-03 DIAGNOSIS — L309 Dermatitis, unspecified: Secondary | ICD-10-CM | POA: Diagnosis not present

## 2023-07-03 DIAGNOSIS — I272 Pulmonary hypertension, unspecified: Secondary | ICD-10-CM | POA: Insufficient documentation

## 2023-07-03 DIAGNOSIS — N1831 Chronic kidney disease, stage 3a: Secondary | ICD-10-CM | POA: Insufficient documentation

## 2023-07-03 DIAGNOSIS — Z0000 Encounter for general adult medical examination without abnormal findings: Secondary | ICD-10-CM | POA: Insufficient documentation

## 2023-07-03 DIAGNOSIS — E871 Hypo-osmolality and hyponatremia: Secondary | ICD-10-CM | POA: Insufficient documentation

## 2023-07-03 DIAGNOSIS — R0789 Other chest pain: Secondary | ICD-10-CM | POA: Insufficient documentation

## 2023-07-03 DIAGNOSIS — B354 Tinea corporis: Secondary | ICD-10-CM

## 2023-07-03 DIAGNOSIS — A310 Pulmonary mycobacterial infection: Secondary | ICD-10-CM | POA: Insufficient documentation

## 2023-07-03 MED ORDER — CLOTRIMAZOLE-BETAMETHASONE 1-0.05 % EX CREA: 1.0000 | TOPICAL_CREAM | Freq: Two times a day (BID) | CUTANEOUS | 0 refills | Status: DC

## 2023-07-03 NOTE — Patient Instructions (Addendum)

## 2023-07-03 NOTE — Progress Notes (Signed)
   New Patient Visit   Subjective  Maria Gonzales is a 83 y.o. female who presents for the following: Red patch on Neck  Patient states she has red dry patch located at the neck that she would like to have examined. Patient reports the areas have been there for 2 years. She reports the areas are not bothersome.Patient reports she has previously been treated for these areas. She is currently applying TMC 0.5%. Patient denies Hx of bx. Patient denies family history of skin cancer(s).   The following portions of the chart were reviewed this encounter and updated as appropriate: medications, allergies, medical history  Review of Systems:  No other skin or systemic complaints except as noted in HPI or Assessment and Plan.  Objective  Well appearing patient in no apparent distress; mood and affect are within normal limits.  A focused examination was performed of the following areas: Anterior Neck  Relevant exam findings are noted in the Assessment and Plan.      Assessment & Plan   1. 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 The lesion has shown minimal growth and occasional itching, particularly after scaling. Patient has been using triamcinolone ointment intermittently, with the lesion fading and recurring. Differential diagnoses include eczema, tinea corporis (ringworm), and erythema annulare centrifugum (EAC). The location and chronicity of the lesion, along with its response to topical steroids, support these potential diagnoses. A biopsy was considered but deferred in favor of a trial of topical antifungal and steroid combination therapy.  - Plan:    Prescribe clotrimazole-betamethasone cream     - Apply twice daily (morning and night) for 3 weeks     - May discontinue earlier if lesion resolves    Patient education:     - Instructed to contact office via MyChart if  there are any issues with prescription coverage or availability    Follow up in 3 weeks for reassessment  TINEA CORPORIS    Return in about 3 months (around 10/03/2023) for Spot F/U (Ok'd per JD to DB in Acne, Accutane or Wart F/U).  I, Jetta Ager, am acting as Neurosurgeon for [employer], DO.  Documentation: I have reviewed the above documentation for accuracy and completeness, and I agree with the above.  Louana Roup, DO

## 2023-07-04 ENCOUNTER — Ambulatory Visit: Admitting: Internal Medicine

## 2023-07-30 ENCOUNTER — Other Ambulatory Visit: Payer: Self-pay | Admitting: Dermatology

## 2023-07-30 ENCOUNTER — Ambulatory Visit: Admitting: Dermatology

## 2023-07-30 ENCOUNTER — Encounter: Payer: Self-pay | Admitting: Dermatology

## 2023-07-30 VITALS — BP 146/87 | HR 83

## 2023-07-30 DIAGNOSIS — L309 Dermatitis, unspecified: Secondary | ICD-10-CM | POA: Diagnosis not present

## 2023-07-30 DIAGNOSIS — B354 Tinea corporis: Secondary | ICD-10-CM

## 2023-07-30 MED ORDER — TACROLIMUS 0.1 % EX OINT: TOPICAL_OINTMENT | Freq: Two times a day (BID) | CUTANEOUS | 9 refills | Status: DC

## 2023-07-30 NOTE — Progress Notes (Signed)
   Follow-Up Visit   Subjective  Maria Gonzales is a 83 y.o. female who presents for the following: Spot Check Maria Gonzales presents for follow-up of a persistent skin condition, initially suspected to be ringworm or inflammation. She was previously prescribed clotrimazole -betamethasone  cream to use twice daily for 3 weeks.  The patient reports that the clotrimazole -betamethasone  cream has been effective in reducing itching and scaling, but the condition recurs when she discontinues use. She has been applying the cream consistently every day, occasionally twice a day, for approximately 3.5 weeks. The last application was yesterday morning. While the area appears less scaly, it remains pink.  Additionally, Maria Gonzales mentions a recent severe rash on her legs that was very red and itchy. Benadryl provided some relief. She notes recent outdoor activities, including exposure to grass and use of a portable weed spray. The patient also reports a history of erythematous papules that react to various stimuli, including ant bites under her gloves, which eventually resolved.  The following portions of the chart were reviewed this encounter and updated as appropriate: medications, allergies, medical history  Review of Systems:  No other skin or systemic complaints except as noted in HPI or Assessment and Plan.  Objective  Well appearing patient in no apparent distress; mood and affect are within normal limits.  A focused examination was performed of the following areas: Left Neck  Relevant exam findings are noted in the Assessment and Plan.      Assessment & Plan   1. 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 Cream alleviated itching and scaling, but condition recurred upon discontinuation. Despite consistent use for 3.5 weeks (longer than prescribed 3 weeks),  condition persists. Affected area appears less scaly but remains pink. Suspecting eczematous dermatitis (EAC) given prolonged use of strong steroid cream without complete resolution.  - Plan:    Discontinue clotrimazole -betamethasone  cream    Start tacrolimus  ointment twice daily for 1 month, then continue daily use for a total of 3 months    Patient to retain previous cream (clotrimazole -betamethasone ) for potential future use    If condition recurs before follow-up, patient instructed to restart tacrolimus  ointment  Follow-up in 3 months to reassess condition and determine if recurrence is ongoing.  2. Irritant dermatitis on legs - Assessment: Patient reports recent onset of awful rash on legs, characterized as very red and itchy. Potential triggers include outdoor exposure to grass and use of portable weed spray. History of erythematous papules reacting to various stimuli, including previous ant bites under gloves.  - Plan:    Apply fluticasone and betamethasone  cream (previously prescribed for neck) to affected areas on legs twice daily for 2 weeks, then discontinue    Patient advised to be cautious of potential environmental triggers, particularly noting the reaction to ant bites   TINEA CORPORIS   Related Medications clotrimazole -betamethasone  (LOTRISONE ) cream Apply 1 Application topically 2 (two) times daily. Apply 2 times daily for 3 weeks tacrolimus  (PROTOPIC ) 0.1 % ointment Apply topically 2 (two) times daily. To the Spot on Left Side of Neck  Return in about 3 months (around 10/30/2023) for Dermatist F/U.  I, Jetta Ager, am acting as Neurosurgeon for [employer], DO.  Documentation: I have reviewed the above documentation for accuracy and completeness, and I agree with the above.  Delon Lenis, DO

## 2023-07-30 NOTE — Patient Instructions (Addendum)
 Date: Wed Jul 30 2023  Hello Mrs. Solorio,  Thank you for visiting today. Here is a summary of the key instructions:  Medications: - Stop using clotrimazole -betamethasone  cream - Use tacrolimus  ointment twice a day for 1 month   - Continue using for up to 3 months if needed - For leg rash, use Clotrimazole / betamethasone  cream twice a day for 2 weeks, then stop  Follow-up: - Return for follow-up appointment in 3 months - Come in sooner if any conditions get worse  Skin Care: - Keep the clotrimazole -betamethasone  cream in case it's needed later - If the rash comes back after stopping tacrolimus , start using it again   Please reach out if you have any questions or concerns.  Warm regards,  Dr. Delon Lenis Dermatology       Important Information   Due to recent changes in healthcare laws, you may see results of your pathology and/or laboratory studies on MyChart before the doctors have had a chance to review them. We understand that in some cases there may be results that are confusing or concerning to you. Please understand that not all results are received at the same time and often the doctors may need to interpret multiple results in order to provide you with the best plan of care or course of treatment. Therefore, we ask that you please give us  2 business days to thoroughly review all your results before contacting the office for clarification. Should we see a critical lab result, you will be contacted sooner.     If You Need Anything After Your Visit   If you have any questions or concerns for your doctor, please call our main line at [phone]. If no one answers, please leave a voicemail as directed and we will return your call as soon as possible. Messages left after 4 pm will be answered the following business day.    You may also send us  a message via MyChart. We typically respond to MyChart messages within 1-2 business days.  For prescription refills, please  ask your pharmacy to contact our office. Our fax number is [phone].  If you have an urgent issue when the clinic is closed that cannot wait until the next business day, you can page your doctor at the number below.     Please note that while we do our best to be available for urgent issues outside of office hours, we are not available 24/7.    If you have an urgent issue and are unable to reach us , you may choose to seek medical care at your doctor's office, retail clinic, urgent care center, or emergency room.   If you have a medical emergency, please immediately call 911 or go to the emergency department. In the event of inclement weather, please call our main line at [phone] for an update on the status of any delays or closures.  Dermatology Medication Tips: Please keep the boxes that topical medications come in in order to help keep track of the instructions about where and how to use these. Pharmacies typically print the medication instructions only on the boxes and not directly on the medication tubes.   If your medication is too expensive, please contact our office at [phone] or send us  a message through MyChart.    We are unable to tell what your co-pay for medications will be in advance as this is different depending on your insurance coverage. However, we may be able to find a substitute medication at lower  cost or fill out paperwork to get insurance to cover a needed medication.    If a prior authorization is required to get your medication covered by your insurance company, please allow us  1-2 business days to complete this process.   Drug prices often vary depending on where the prescription is filled and some pharmacies may offer cheaper prices.   The website www.goodrx.com contains coupons for medications through different pharmacies. The prices here do not account for what the cost may be with help from insurance (it may be cheaper with your insurance), but the  website can give you the price if you did not use any insurance.  - You can print the associated coupon and take it with your prescription to the pharmacy.  - You may also stop by our office during regular business hours and pick up a GoodRx coupon card.  - If you need your prescription sent electronically to a different pharmacy, notify our office through [facility] or by phone at [phone]

## 2023-07-31 ENCOUNTER — Other Ambulatory Visit: Payer: Self-pay | Admitting: Dermatology

## 2023-07-31 ENCOUNTER — Other Ambulatory Visit: Payer: Self-pay

## 2023-07-31 MED ORDER — PIMECROLIMUS 1 % EX CREA: TOPICAL_CREAM | Freq: Two times a day (BID) | CUTANEOUS | 3 refills | Status: DC

## 2023-07-31 NOTE — Progress Notes (Signed)
 Insurance required switch to pimecrolimus  so sent that to pharmacy

## 2023-08-05 ENCOUNTER — Other Ambulatory Visit: Payer: Self-pay

## 2023-08-05 MED ORDER — CALCIPOTRIENE 0.005 % EX CREA: TOPICAL_CREAM | Freq: Two times a day (BID) | CUTANEOUS | 3 refills | Status: AC

## 2023-08-05 NOTE — Progress Notes (Signed)
 Pt said tacrolimus  and pimecrolimus  not covered so switched to calcipitrone per dr d

## 2023-08-24 NOTE — Progress Notes (Unsigned)
 Maria Gonzales, female    DOB: [birth year]   MRN: [id number]   Brief patient profile:  82 yowf never smoker/MM grew up taking care of chickens  referred to pulmonary clinic 06/02/2023 by Dr Onita  for bronchiectasis last saw [facility] 2020   with   dx of bronchiectasis / nl pfts    Unusual cough in late summer of 2024 > turned purulent and rx abx and HRCT  02/17/23 worse bronchiectasis / mai     History of Present Illness  06/02/2023  Pulmonary/ 1st office eval/Maria Gonzales  Chief Complaint  Patient presents with   Consult    Bronchiectasis  Dyspnea:  yardwork verna  thru woods to another cabin her husband build on their farm s limiting sob  Cough: quite sslt off color  Sleep: bed is flat with one pillow  SABA use: none  02 ldz:[id number]  Rec Bronchiectasis =   you have scarring of your bronchial tubes s For cough/ congestion > mucinex or mucinex dm  up to maximum of  1200 mg every 12 hours and use the flutter valve as much as you can   When mucus turns nasty > doxycyline 100 mg twice daily before meals with glass of water (refillable)  Please schedule a follow up visit in 3 months but call sooner if needed    - Bronchiectasis labs  06/02/2023 >>> Quant TB neg/Quant Ig's nl except for  IgE 331  - Alpha one phenotype 06/02/23  = MM   level 177    08/28/2023  f/u ov/Maria Gonzales re: bronchiectasis  maint on no rx at this point   Chief Complaint  Patient presents with   Follow-up    26m f/u Pt states she has some general questions   Dyspnea:  Not limited by breathing from desired activities   Cough: varies but if gets discolored at all it is just transient / not really using mucinex dm or flutter valve yet Sleeping: flat bed and one pillow s  resp cc  SABA use: none  02: none     No obvious day to day or daytime variability or assoc excess/ purulent sputum or mucus plugs or hemoptysis or cp or chest tightness, subjective wheeze or overt sinus or hb symptoms.    Also denies any obvious fluctuation of  symptoms with weather or environmental changes or other aggravating or alleviating factors except as outlined above   No unusual exposure hx or h/o childhood pna/ asthma or knowledge of premature birth.  Current Allergies, Complete Past Medical History, Past Surgical History, Family History, and Social History were reviewed in [employer] record.  ROS  The following are not active complaints unless bolded Hoarseness, sore throat, dysphagia, dental problems, itching, sneezing,  nasal congestion or discharge of excess mucus or purulent secretions, ear ache,   fever, chills, sweats, unintended wt loss or wt gain, classically pleuritic or exertional cp,  orthopnea pnd or arm/hand swelling  or leg swelling, presyncope, palpitations, abdominal pain, anorexia, nausea, vomiting, diarrhea  or change in bowel habits or change in bladder habits, change in stools or change in urine, dysuria, hematuria,  rash, arthralgias, visual complaints, headache, numbness, weakness or ataxia or problems with walking or coordination,  change in mood or  memory.        Current Meds  Medication Sig   calcipotriene  (DOVONOX) 0.005 % cream Apply topically 2 (two) times daily.   CALCIUM PO Take 1 tablet by mouth daily.   cholecalciferol (VITAMIN  D3) 25 MCG (1000 UNIT) tablet Take 1,000 Units by mouth daily.   Cyanocobalamin (VITAMIN B-12 PO) Take 1 tablet by mouth daily.   hydrochlorothiazide (HYDRODIURIL) 12.5 MG tablet Take 12.5 mg by mouth daily.   valsartan (DIOVAN) 320 MG tablet Take 320 mg by mouth daily.       Past Medical History:  Diagnosis Date   Hypertension       Objective:    wts  08/28/2023         119   06/02/23 119 lb 12.8 oz (54.3 kg)  04/10/21 120 lb 12.8 oz (54.8 kg)  03/08/21 113 lb 15.7 oz (51.7 kg)    Vital signs reviewed  08/28/2023  - Note at rest 02 sats  95% on RA   General appearance:    amb pleasant wf nad    HEENT : Oropharynx  clear      Nasal turbinates nl     NECK :  without  apparent JVD/ palpable Nodes/TM    LUNGS: no acc muscle use,  Nl contour chest which is clear to A and P bilaterally without cough on insp or exp maneuvers   CV:  RRR  no s3 or murmur or increase in P2, and no edema   ABD:  soft and nontender   MS:  Gait nl  ext warm without deformities Or obvious joint restrictions  calf tenderness, cyanosis or clubbing    SKIN: warm and dry without lesions    NEURO:  alert, approp, nl sensorium with  no motor or cerebellar deficits apparent.       CXR PA and Lateral:   06/02/2023 :    I personally reviewed images and impression is as follows:     c/w mild / mod bronchiectasis/ MAI s consolidation     Assessment     Assessment & Plan Bronchiectasis without complication (HCC)  Never smoker/MM  but grew up on a farm  - HRCT  02/17/23 worse bronchiectasis / MAI - 06/02/2023 rec mucinex max/ flutter valve and doxy x 10 d cycles prn nasy mucus  - Bronchiectasis labs  06/02/2023 >>> Quant TB neg/Quant Ig's nl except for  IgE 331  - Alpha one phenotype 06/02/23  = MM   level 177   She is very anxious about following instructions re contingencies above and so actually hasn't use them yet.   However, most likely she will eventually flare with purulent sputum production and if she doesn't respond to doxy would collect mucus for TB/ ID referral  If cough gets more rattly/ productive and not better with flutter /mucinex needs VEST   Regular pulmonary f/u is not however needed nor are yearly CT's at this point.      Each maintenance medication was reviewed in detail including emphasizing most importantly the difference between maintenance and prns and under what circumstances the prns are to be triggered using an action plan format where appropriate.  Total time for H and P, chart review, counseling, reviewing flutter  device(s) and generating customized AVS unique to this office visit / same day charting = 30 min final summary f/u  ov          Patient Instructions   If you are satisfied with your treatment plan,  let your doctor know and he can either refill your medications or you can return here when your prescription runs out.   If doxycline does not help the problem you will need to collect an AM sputum and  referral to ID at [facility]     If in any way you are not 100% satisfied,  please tell us .  If 100% better, tell your friends!  Pulmonary follow up is as needed     Ozell America, MD 08/28/2023

## 2023-08-28 ENCOUNTER — Encounter: Payer: Self-pay | Admitting: Internal Medicine

## 2023-08-28 ENCOUNTER — Ambulatory Visit: Admitting: Internal Medicine

## 2023-08-28 VITALS — BP 152/77 | HR 92 | Temp 98.1°F | Ht 63.5 in | Wt 119.8 lb

## 2023-08-28 DIAGNOSIS — J479 Bronchiectasis, uncomplicated: Secondary | ICD-10-CM | POA: Diagnosis not present

## 2023-08-28 NOTE — Patient Instructions (Signed)
 If you are satisfied with your treatment plan,  let your doctor know and he can either refill your medications or you can return here when your prescription runs out.   If doxycline does not help the problem you will need to collect an AM sputum and referral to ID at [facility]     If in any way you are not 100% satisfied,  please tell us .  If 100% better, tell your friends!  Pulmonary follow up is as needed

## 2023-08-28 NOTE — Assessment & Plan Note (Addendum)
 Never smoker/MM  but grew [place] on a farm  - HRCT  02/17/23 worse bronchiectasis / MAI - 06/02/2023 rec mucinex max/ flutter valve and doxy x 10 d cycles prn nasy mucus  - Bronchiectasis labs  06/02/2023 >>> Quant TB neg/Quant Ig's nl except for  IgE 331  - Alpha one phenotype 06/02/23  = MM   level 177   She is very anxious about following instructions re contingencies above and so actually hasn't use them yet.   However, most likely she will eventually flare with purulent sputum production and if she doesn't respond to doxy would collect mucus for TB/ ID referral  If cough gets more rattly/ productive and not better with flutter /mucinex needs VEST   Regular pulmonary f/u is not however needed nor are yearly CT's at this point.      Each maintenance medication was reviewed in detail including emphasizing most importantly the difference between maintenance and prns and under what circumstances the prns are to be triggered using an action plan format where appropriate.  Total time for H and P, chart review, counseling, reviewing flutter  device(s) and generating customized AVS unique to this office visit / same day charting = 30 min final summary f/u ov

## 2023-11-05 ENCOUNTER — Ambulatory Visit: Admitting: Dermatology

## 2023-11-05 ENCOUNTER — Encounter: Payer: Self-pay | Admitting: Dermatology

## 2023-11-05 VITALS — BP 116/75 | HR 81

## 2023-11-05 DIAGNOSIS — D485 Neoplasm of uncertain behavior of skin: Secondary | ICD-10-CM | POA: Diagnosis not present

## 2023-11-05 DIAGNOSIS — D044 Carcinoma in situ of skin of scalp and neck: Secondary | ICD-10-CM | POA: Diagnosis not present

## 2023-11-05 NOTE — Progress Notes (Addendum)
   Follow-Up Visit   Subjective  Maria Gonzales is a 83 y.o. female who presents for the following: Dermatitis  Patient present today for follow up visit for dermatitis. Patient was last evaluated on 07/30/23. At this visit patient was prescribed Tacrolimus . Tacrolimus  was not covered by her insurance so Calcipotriene  was sent in. She reports it helps some but never fully resolves. Today she rates her itch 5 out of 10. She last used the Cream on Sunday. Patient reports sxs are unchanged. Patient denies medication changes.  The following portions of the chart were reviewed this encounter and updated as appropriate: medications, allergies, medical history  Review of Systems:  No other skin or systemic complaints except as noted in HPI or Assessment and Plan.  Objective  Well appearing patient in no apparent distress; mood and affect are within normal limits.  A focused examination was performed of the following areas: Neck  Relevant exam findings are noted in the Assessment and Plan.       Left Anterior Neck Erythematous pink scaly plaque  Assessment & Plan   Pink scaly plaque on left neck Persistent pink scaly plaque on the left side of the neck since at least July. Initial treatment with clotrimazole  betamethasone  was discontinued due to potential skin thinning, and calcipotriene  was used as an alternative. The plaque improved slightly but did not resolve and became crusty from scratching.   Differential diagnosis includes actinic keratosis, nummular eczema, and tinea. Actinic keratosis is considered due to the plaque's resistance to treatment and its scaly nature, although it is typically not itchy. Nummular eczema and tinea are also considered, with tinea being less likely due to prior antifungal treatment. A biopsy is necessary to confirm the diagnosis and guide treatment.  - Perform shave biopsy of the plaque to differentiate between actinic keratosis, nummular eczema, and tinea. -  If biopsy confirms actinic keratosis, treat with liquid nitrogen cryotherapy. - If biopsy indicates a rash, consider changing the topical cream. - Provide post-procedure care instructions and apply ointment and a Band-Aid to the biopsy site. - Discussed the procedure, including numbing the area with a tiny needle, performing a shave biopsy, and the potential need for liquid nitrogen treatment if actinic keratosis is confirmed. NEOPLASM OF UNCERTAIN BEHAVIOR OF SKIN Left Anterior Neck Epidermal / dermal shaving  Lesion diameter (cm):  0.8 Informed consent: discussed and consent obtained   Timeout: patient name, date of birth, surgical site, and procedure verified   Procedure prep:  Patient was prepped and draped in usual sterile fashion Prep type:  Isopropyl alcohol Anesthesia: the lesion was anesthetized in a standard fashion   Anesthetic:  1% lidocaine w/ epinephrine 1-100,000 buffered w/ 8.4% NaHCO3 Instrument used: DermaBlade   Hemostasis achieved with: aluminum chloride   Outcome: patient tolerated procedure well   Post-procedure details: sterile dressing applied and wound care instructions given   Dressing type: petrolatum   Additional details:  Pt aware that benign results will be sent to mychart and the staff will call abnormal results will  Specimen 1 - Surgical pathology Differential Diagnosis: R/O AK vs Nummular Eczema vs Tinea vs other  Check Margins: No  Return for appt pending Bx Results, Office will call to schedule.  I, Jetta Ager, am acting as neurosurgeon for [employer], DO.  Documentation: I have reviewed the above documentation for accuracy and completeness, and I agree with the above.  Delon Lenis, DO

## 2023-11-05 NOTE — Patient Instructions (Signed)

## 2023-11-11 ENCOUNTER — Ambulatory Visit: Payer: Self-pay | Admitting: Dermatology

## 2023-11-11 DIAGNOSIS — D099 Carcinoma in situ, unspecified: Secondary | ICD-10-CM

## 2023-11-11 LAB — SURGICAL PATHOLOGY

## 2023-11-11 NOTE — Progress Notes (Signed)
 HI Shirron,  Please call pt with results.  She will need to schedule apt for LN2 as treatment.  This can be done during a regular office visit within the next few months.  -Dr Alm   1. Skin, left anterior neck :       SQUAMOUS CELL CARCINOMA IN SITU

## 2023-11-12 ENCOUNTER — Encounter: Payer: Self-pay | Admitting: Dermatology

## 2023-11-12 DIAGNOSIS — D099 Carcinoma in situ, unspecified: Secondary | ICD-10-CM | POA: Insufficient documentation

## 2023-12-01 ENCOUNTER — Encounter: Payer: Self-pay | Admitting: Dermatology

## 2024-01-01 ENCOUNTER — Ambulatory Visit: Admitting: Dermatology

## 2024-01-01 ENCOUNTER — Encounter: Payer: Self-pay | Admitting: Dermatology

## 2024-01-01 VITALS — BP 128/68

## 2024-01-01 DIAGNOSIS — D044 Carcinoma in situ of skin of scalp and neck: Secondary | ICD-10-CM

## 2024-01-01 DIAGNOSIS — L219 Seborrheic dermatitis, unspecified: Secondary | ICD-10-CM

## 2024-01-01 DIAGNOSIS — L918 Other hypertrophic disorders of the skin: Secondary | ICD-10-CM

## 2024-01-01 DIAGNOSIS — D099 Carcinoma in situ, unspecified: Secondary | ICD-10-CM

## 2024-01-01 NOTE — Progress Notes (Signed)
" ° °  Follow-Up Visit   Subjective  Maria Gonzales is a 83 y.o. female established patient who presents for FOLLOW UP on the diagnoses listed below:  Patient was last evaluated on 11/05/23.   SCCIS: Pt presents today for Tx with LN2 of Bx proven SCCIS on L anterior neck.    The following portions of the chart were reviewed this encounter and updated as appropriate: medications, allergies, medical history  Review of Systems:  No other skin or systemic complaints except as noted in HPI or Assessment and Plan.  Objective  Well appearing patient in no apparent distress; mood and affect are within normal limits    A focused examination was performed of the following areas: L anterior neck   Relevant exam findings are noted in the Assessment and Plan.   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 The lesion is being treated with cryotherapy to freeze and kill the cancerous cells. - Performed cryotherapy with two 30-second freeze cycles using liquid nitrogen. - Instructed to apply Aquaphor and cover with a bandage post-procedure. - Scheduled follow-up skin examination in six months to monitor for new skin cancers.  Seborrheic dermatitis Intermittent itchiness of scalp consistent with seborrheic dermatitis. - Prescribed clobetasol with five refills for management of seborrheic dermatitis.  Skin tag, right neck Skin tag on the right neck causing discomfort. Discussed that skin tags are not typically covered by insurance unless medically necessary. - Offered cryotherapy to freeze the skin tag, which will crust and fall off in one to two weeks. SQUAMOUS CELL CARCINOMA IN SITU (SCCIS) Left Anterior Neck - Destruction of lesion Complexity: simple   Destruction method: cryotherapy   Informed consent: discussed and consent obtained    Timeout:  patient name, date of birth, surgical site, and procedure verified Lesion destroyed using liquid nitrogen: Yes   Region frozen until ice ball extended beyond lesion: Yes   Cryotherapy cycles:  2 Lesion length (cm):  1 Margin per side (cm):  0.3 Final wound size (cm):  1.6 Outcome: patient tolerated procedure well with no complications   Post-procedure details: wound care instructions given     Return in about 6 months (around 07/01/2024) for TBSE w/ BKS as JD will be OOO.   Documentation: I have reviewed the above documentation for accuracy and completeness, and I agree with the above.  I, Shirron Maranda, CMA II, am acting as scribe for:  Delon Lenis, DO "

## 2024-01-01 NOTE — Patient Instructions (Addendum)

## 2024-07-06 ENCOUNTER — Ambulatory Visit: Admitting: Physician Assistant
